# Patient Record
Sex: Male | Born: 2000 | Race: White | Hispanic: No | Marital: Single | Smoking: Never smoker
Health system: Southern US, Community
[De-identification: ages and names within clinical notes are randomized; demographics above are authoritative.]

## PROBLEM LIST (undated history)

## (undated) DIAGNOSIS — T7840XA Allergy, unspecified, initial encounter: Secondary | ICD-10-CM

## (undated) HISTORY — DX: Allergy, unspecified, initial encounter: T78.40XA

## (undated) HISTORY — PX: TONSILLECTOMY: SUR1361

---
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
## 2021-02-27 ENCOUNTER — Ambulatory Visit: Payer: Self-pay | Admitting: Family Medicine

## 2021-05-23 ENCOUNTER — Ambulatory Visit: Payer: Self-pay | Admitting: Family Medicine

## 2021-08-15 ENCOUNTER — Ambulatory Visit: Payer: Self-pay | Admitting: Family Medicine

## 2022-02-19 ENCOUNTER — Ambulatory Visit: Payer: Self-pay | Admitting: Family Medicine

## 2022-06-15 ENCOUNTER — Ambulatory Visit: Payer: Commercial Managed Care - HMO | Admitting: Physician Assistant

## 2022-08-30 ENCOUNTER — Telehealth: Payer: Self-pay | Admitting: Physician Assistant

## 2022-08-30 NOTE — Progress Notes (Signed)
Vivien Rota DeSanto,acting as a scribe for Alfredia Ferguson, PA-C.,have documented all relevant documentation on the behalf of Alfredia Ferguson, PA-C,as directed by  Alfredia Ferguson, PA-C while in the presence of Alfredia Ferguson, PA-C.   New patient visit   Patient: Mario Ruiz   DOB: Aug 19, 2000   21 y.o. Male  MRN: [id number] Visit Date: 08/31/2022  Today's healthcare provider: Alfredia Ferguson, PA-C   Cc. cpe Subjective    Mario Ruiz is a 22 y.o. male who presents today as a new patient to establish care.  HPI  Pt reports a textured rash across his arms and back that has been present for years. Denies itching, pain.  Past Medical History:  Diagnosis Date   Allergy    Past Surgical History:  Procedure Laterality Date   TONSILLECTOMY     age 41-16   No family status information on file.   History reviewed. No pertinent family history. Social History   Socioeconomic History   Marital status: Single    Spouse name: Not on file   Number of children: Not on file   Years of education: Not on file   Highest education level: Not on file  Occupational History   Not on file  Tobacco Use   Smoking status: Never   Smokeless tobacco: Never  Substance and Sexual Activity   Alcohol use: Never   Drug use: Never   Sexual activity: Not on file  Other Topics Concern   Not on file  Social History Narrative   Not on file   Social Determinants of Health   Financial Resource Strain: Not on file  Food Insecurity: Not on file  Transportation Needs: Not on file  Physical Activity: Not on file  Stress: Not on file  Social Connections: Not on file   Outpatient Medications Prior to Visit  Medication Sig   loratadine (CLARITIN) 10 MG tablet Take 10 mg by mouth daily.   No facility-administered medications prior to visit.   No Known Allergies   There is no immunization history on file for this patient.  Health Maintenance  Topic Date Due   COVID-19 Vaccine (1) Never  done   HPV VACCINES (1 - Male 2-dose series) Never done   HIV Screening  Never done   Hepatitis C Screening  Never done   DTaP/Tdap/Td (1 - Tdap) Never done   INFLUENZA VACCINE  11/15/2022    Patient Care Team: Alfredia Ferguson, PA-C as PCP - General (Physician Assistant)  Review of Systems  HENT:  Positive for sneezing.   Respiratory:  Positive for choking.   Genitourinary:  Positive for frequency.  Skin:  Positive for rash.  Allergic/Immunologic: Positive for environmental allergies.    Objective    BP 133/88 (BP Location: Right Arm, Patient Position: Bed low/side rails up)   Pulse (!) 113   Temp 98.3 F (36.8 C) (Oral)   Wt 231 lb (104.8 kg)   SpO2 99%  Vitals:   08/31/22 0925 08/31/22 0933  BP: (!) 157/94 133/88  Pulse: (!) 113   Temp: 98.3 F (36.8 C)   TempSrc: Oral   SpO2: 99%   Weight: 231 lb (104.8 kg)      Physical Exam Constitutional:      General: He is awake.     Appearance: He is well-developed.  HENT:     Head: Normocephalic.     Right Ear: Tympanic membrane, ear canal and external ear normal.     Left Ear: Tympanic membrane,  ear canal and external ear normal.     Nose: Nose normal. No congestion or rhinorrhea.     Mouth/Throat:     Mouth: Mucous membranes are moist.     Pharynx: No oropharyngeal exudate or posterior oropharyngeal erythema.  Eyes:     Pupils: Pupils are equal, round, and reactive to light.  Cardiovascular:     Rate and Rhythm: Normal rate and regular rhythm.     Heart sounds: Normal heart sounds.  Pulmonary:     Effort: Pulmonary effort is normal.     Breath sounds: Normal breath sounds.  Abdominal:     General: There is no distension.     Palpations: Abdomen is soft.     Tenderness: There is no abdominal tenderness. There is no guarding.  Musculoskeletal:     Cervical back: Normal range of motion.     Right lower leg: No edema.     Left lower leg: No edema.  Lymphadenopathy:     Cervical: No cervical adenopathy.   Skin:    General: Skin is warm.     Comments: There is a textured erythematous prickling of the skin across his arms and back  Neurological:     Mental Status: He is alert and oriented to person, place, and time.  Psychiatric:        Attention and Perception: Attention normal.        Mood and Affect: Mood normal.        Speech: Speech normal.        Behavior: Behavior normal. Behavior is cooperative.    Depression Screen    08/31/2022    9:30 AM  PHQ 2/9 Scores  PHQ - 2 Score 0  PHQ- 9 Score 0   No results found for any visits on 08/31/22.  Assessment & Plan      Problem List Items Addressed This Visit       Musculoskeletal and Integument   Keratosis pilaris    Advised combo of exfoliation and hydrating lotion.        Other Visit Diagnoses     Annual physical exam    -  Primary   Relevant Orders   CBC w/Diff/Platelet   Comprehensive Metabolic Panel (CMET)   Lipid Profile        Return in about 1 year (around 08/31/2023) for CPE.     I, Alfredia Ferguson, PA-C have reviewed all documentation for this visit. The documentation on  08/31/22   for the exam, diagnosis, procedures, and orders are all accurate and complete.  Alfredia Ferguson, PA-C [facility] [address] [place], [place], [address] Office: [phone] Fax: [phone]   [facility]

## 2022-08-31 ENCOUNTER — Ambulatory Visit (INDEPENDENT_AMBULATORY_CARE_PROVIDER_SITE_OTHER): Payer: Medicaid Other | Admitting: Physician Assistant

## 2022-08-31 ENCOUNTER — Encounter: Payer: Self-pay | Admitting: Physician Assistant

## 2022-08-31 VITALS — BP 133/88 | HR 113 | Temp 98.3°F | Wt 231.0 lb

## 2022-08-31 DIAGNOSIS — Z0000 Encounter for general adult medical examination without abnormal findings: Secondary | ICD-10-CM | POA: Diagnosis not present

## 2022-08-31 DIAGNOSIS — J302 Other seasonal allergic rhinitis: Secondary | ICD-10-CM | POA: Insufficient documentation

## 2022-08-31 DIAGNOSIS — L858 Other specified epidermal thickening: Secondary | ICD-10-CM | POA: Diagnosis not present

## 2022-08-31 NOTE — Assessment & Plan Note (Signed)
Advised combo of exfoliation and hydrating lotion.

## 2022-09-01 LAB — CBC WITH DIFFERENTIAL/PLATELET
Basophils Absolute: 0 10*3/uL (ref 0.0–0.2)
Basos: 1 %
EOS (ABSOLUTE): 0.3 10*3/uL (ref 0.0–0.4)
Eos: 5 %
Hematocrit: 46.6 % (ref 37.5–51.0)
Hemoglobin: 15.7 g/dL (ref 13.0–17.7)
Immature Grans (Abs): 0 10*3/uL (ref 0.0–0.1)
Immature Granulocytes: 0 %
Lymphocytes Absolute: 1.9 10*3/uL (ref 0.7–3.1)
Lymphs: 37 %
MCH: 30 pg (ref 26.6–33.0)
MCHC: 33.7 g/dL (ref 31.5–35.7)
MCV: 89 fL (ref 79–97)
Monocytes Absolute: 0.4 10*3/uL (ref 0.1–0.9)
Monocytes: 7 %
Neutrophils Absolute: 2.7 10*3/uL (ref 1.4–7.0)
Neutrophils: 50 %
Platelets: 211 10*3/uL (ref 150–450)
RBC: 5.23 x10E6/uL (ref 4.14–5.80)
RDW: 12 % (ref 11.6–15.4)
WBC: 5.3 10*3/uL (ref 3.4–10.8)

## 2022-09-01 LAB — COMPREHENSIVE METABOLIC PANEL
ALT: 54 IU/L — ABNORMAL HIGH (ref 0–44)
AST: 23 IU/L (ref 0–40)
Albumin/Globulin Ratio: 1.7 (ref 1.2–2.2)
Albumin: 4.7 g/dL (ref 4.3–5.2)
Alkaline Phosphatase: 88 IU/L (ref 51–125)
BUN/Creatinine Ratio: 13 (ref 9–20)
BUN: 13 mg/dL (ref 6–20)
Bilirubin Total: 0.4 mg/dL (ref 0.0–1.2)
CO2: 20 mmol/L (ref 20–29)
Calcium: 9.9 mg/dL (ref 8.7–10.2)
Chloride: 106 mmol/L (ref 96–106)
Creatinine, Ser: 1.04 mg/dL (ref 0.76–1.27)
Globulin, Total: 2.7 g/dL (ref 1.5–4.5)
Glucose: 91 mg/dL (ref 70–99)
Potassium: 5.1 mmol/L (ref 3.5–5.2)
Sodium: 142 mmol/L (ref 134–144)
Total Protein: 7.4 g/dL (ref 6.0–8.5)
eGFR: 105 mL/min/{1.73_m2} (ref >59)

## 2022-09-01 LAB — LIPID PANEL
Chol/HDL Ratio: 3.8 ratio (ref 0.0–5.0)
Cholesterol, Total: 166 mg/dL (ref 100–199)
HDL: 44 mg/dL (ref >39)
LDL Chol Calc (NIH): 110 mg/dL — ABNORMAL HIGH (ref 0–99)
Triglycerides: 60 mg/dL (ref 0–149)
VLDL Cholesterol Cal: 12 mg/dL (ref 5–40)

## 2022-09-03 ENCOUNTER — Other Ambulatory Visit: Payer: Self-pay | Admitting: Physician Assistant

## 2022-09-03 DIAGNOSIS — R748 Abnormal levels of other serum enzymes: Secondary | ICD-10-CM

## 2022-11-16 DIAGNOSIS — J069 Acute upper respiratory infection, unspecified: Secondary | ICD-10-CM | POA: Diagnosis not present

## 2022-11-16 DIAGNOSIS — Z20822 Contact with and (suspected) exposure to covid-19: Secondary | ICD-10-CM | POA: Diagnosis not present

## 2022-12-05 ENCOUNTER — Telehealth: Payer: Self-pay

## 2022-12-05 NOTE — Telephone Encounter (Signed)
NA/No VM- if patient calls back ok for PEC to advise that labs have been ordered and no need for appointment. Patient just comes in for labs. Lab opens from 8-11 AM and 1-4 PM

## 2022-12-05 NOTE — Telephone Encounter (Signed)
Copied from CRM [id number]. Topic: General - Other >> Dec 05, 2022  1:39 PM Turkey B wrote: Reason for CRM: pt's mother called in states, pt was supposed to have fu lab work done from what he had done in February. She needs to know when this should be done and scheduled.

## 2022-12-12 NOTE — Telephone Encounter (Signed)
NA. Left detailed message that patient can just come for labs. No appointment is needed.

## 2022-12-14 ENCOUNTER — Other Ambulatory Visit: Payer: Self-pay | Admitting: Family Medicine

## 2022-12-14 DIAGNOSIS — R748 Abnormal levels of other serum enzymes: Secondary | ICD-10-CM

## 2022-12-15 LAB — COMPREHENSIVE METABOLIC PANEL
ALT: 68 IU/L — ABNORMAL HIGH (ref 0–44)
AST: 31 IU/L (ref 0–40)
Albumin: 4.9 g/dL (ref 4.3–5.2)
Alkaline Phosphatase: 91 IU/L (ref 51–125)
BUN/Creatinine Ratio: 12 (ref 9–20)
BUN: 12 mg/dL (ref 6–20)
Bilirubin Total: 0.5 mg/dL (ref 0.0–1.2)
CO2: 19 mmol/L — ABNORMAL LOW (ref 20–29)
Calcium: 10.2 mg/dL (ref 8.7–10.2)
Chloride: 103 mmol/L (ref 96–106)
Creatinine, Ser: 0.99 mg/dL (ref 0.76–1.27)
Globulin, Total: 2.7 g/dL (ref 1.5–4.5)
Glucose: 96 mg/dL (ref 70–99)
Potassium: 5.1 mmol/L (ref 3.5–5.2)
Sodium: 140 mmol/L (ref 134–144)
Total Protein: 7.6 g/dL (ref 6.0–8.5)
eGFR: 112 mL/min/{1.73_m2} (ref >59)

## 2022-12-15 LAB — CBC
Hematocrit: 49.9 % (ref 37.5–51.0)
Hemoglobin: 16.1 g/dL (ref 13.0–17.7)
MCH: 29.8 pg (ref 26.6–33.0)
MCHC: 32.3 g/dL (ref 31.5–35.7)
MCV: 92 fL (ref 79–97)
Platelets: 269 10*3/uL (ref 150–450)
RBC: 5.4 x10E6/uL (ref 4.14–5.80)
RDW: 12.9 % (ref 11.6–15.4)
WBC: 6.8 10*3/uL (ref 3.4–10.8)

## 2022-12-15 LAB — LIPID PANEL
Chol/HDL Ratio: 3.6 ratio (ref 0.0–5.0)
Cholesterol, Total: 155 mg/dL (ref 100–199)
HDL: 43 mg/dL (ref >39)
LDL Chol Calc (NIH): 99 mg/dL (ref 0–99)
Triglycerides: 63 mg/dL (ref 0–149)
VLDL Cholesterol Cal: 13 mg/dL (ref 5–40)

## 2023-01-18 ENCOUNTER — Ambulatory Visit: Payer: Medicaid Other | Admitting: Family Medicine

## 2023-01-24 ENCOUNTER — Encounter: Payer: Self-pay | Admitting: Family Medicine

## 2023-01-25 ENCOUNTER — Ambulatory Visit: Payer: Medicaid Other | Admitting: Family Medicine

## 2023-01-25 ENCOUNTER — Encounter: Payer: Self-pay | Admitting: Family Medicine

## 2023-01-25 VITALS — BP 135/86 | HR 128 | Ht 71.0 in | Wt 233.1 lb

## 2023-01-25 DIAGNOSIS — R768 Other specified abnormal immunological findings in serum: Secondary | ICD-10-CM

## 2023-01-25 DIAGNOSIS — R000 Tachycardia, unspecified: Secondary | ICD-10-CM

## 2023-01-25 DIAGNOSIS — R7401 Elevation of levels of liver transaminase levels: Secondary | ICD-10-CM | POA: Insufficient documentation

## 2023-01-25 DIAGNOSIS — Z23 Encounter for immunization: Secondary | ICD-10-CM

## 2023-01-25 NOTE — Progress Notes (Signed)
Established patient visit   Patient: Mario Ruiz   DOB: July 20, 2000   21 y.o. Male  MRN: [id number] Visit Date: 01/25/2023  Today's healthcare provider: Sherlyn Hay, DO   Chief Complaint  Patient presents with   Medical Management of Chronic Issues    Would like to discuss lab results    Subjective    HPI Fam hx liver prob? no Alcohol use? none  Diet: red meat, potatoes, fruit and vegetables  - eats out once per week  - pork chops, burgers, roast, fries, baked potatoes or other potato dishes, chicken, chicken salad, collard greens.     Medications: Outpatient Medications Prior to Visit  Medication Sig   loratadine (CLARITIN) 10 MG tablet Take 10 mg by mouth daily.   No facility-administered medications prior to visit.    Review of Systems  Respiratory: Negative.  Negative for cough, shortness of breath and wheezing.   Cardiovascular:  Negative for chest pain, palpitations (denied, but was noticeable to him after I pointed it out) and leg swelling.  Gastrointestinal:  Negative for abdominal pain, constipation, diarrhea, nausea and vomiting.  Neurological:  Negative for weakness and headaches.        Objective    BP 135/86 (BP Location: Left Arm, Patient Position: Sitting, Cuff Size: Large)   Pulse (!) 128   Ht 5\' 11"  (1.803 m)   Wt 233 lb 1.6 oz (105.7 kg)   SpO2 99%   BMI 32.51 kg/m     Physical Exam Constitutional:      Appearance: Normal appearance.  HENT:     Head: Normocephalic and atraumatic.  Eyes:     General: No scleral icterus.    Extraocular Movements: Extraocular movements intact.     Conjunctiva/sclera: Conjunctivae normal.  Cardiovascular:     Rate and Rhythm: Regular rhythm. Tachycardia present.     Pulses: Normal pulses.     Heart sounds: Normal heart sounds.  Pulmonary:     Effort: Pulmonary effort is normal. No respiratory distress.     Breath sounds: Normal breath sounds.  Abdominal:     General: Bowel sounds are  normal. There is no distension.     Palpations: Abdomen is soft. There is no mass.     Tenderness: There is no abdominal tenderness. There is no guarding.  Musculoskeletal:     Right lower leg: No edema.     Left lower leg: No edema.  Skin:    General: Skin is warm and dry.  Neurological:     Mental Status: He is alert and oriented to person, place, and time. Mental status is at baseline.  Psychiatric:        Mood and Affect: Mood normal.        Behavior: Behavior normal.      No results found for any visits on 01/25/23.  Assessment & Plan    Elevated transaminase level Assessment & Plan: Discussed options to go ahead and work up further, versus waiting three more months (to allow full six month interval) and rechecking then. Given minor level of increase, will go ahead and defer recheck. Did order labwork for patient to obtain at end of November for recheck.  Orders: -     Hepatic function panel -     Gamma GT  Tachycardia Assessment & Plan: Patient tachycardic on exam, ordered TSH as noted below, particularly as thyroid disorders can contribute to patient's increased ALT. However,since it is unclear whether patient's heart  rate is anxiety-provoked in being in the office vs occurring at baseline, patient will monitor heart rate at home and have level drawn sooner if his tachycardia occurs at home as well. If not, he will have it drawn with the hepatic function test.  Orders: -     TSH Rfx on Abnormal to Free T4  Needs flu shot -     Flu vaccine trivalent PF, 6mos and older(Flulaval,Afluria,Fluarix,Fluzone)    Return in about 3 months (around 04/27/2023).      I discussed the assessment and treatment plan with the patient  The patient was provided an opportunity to ask questions and all were answered. The patient agreed with the plan and demonstrated an understanding of the instructions.   The patient was advised to call back or seek an in-person evaluation if the symptoms  worsen or if the condition fails to improve as anticipated.    Sherlyn Hay, DO  [facility] [phone] (phone) [phone] (fax)  [facility]

## 2023-01-25 NOTE — Assessment & Plan Note (Signed)
Patient tachycardic on exam, ordered TSH as noted below, particularly as thyroid disorders can contribute to patient's increased ALT. However,since it is unclear whether patient's heart rate is anxiety-provoked in being in the office vs occurring at baseline, patient will monitor heart rate at home and have level drawn sooner if his tachycardia occurs at home as well. If not, he will have it drawn with the hepatic function test.

## 2023-01-25 NOTE — Assessment & Plan Note (Signed)
Discussed options to go ahead and work up further, versus waiting three more months (to allow full six month interval) and rechecking then. Given minor level of increase, will go ahead and defer recheck. Did order labwork for patient to obtain at end of November for recheck.

## 2023-03-18 DIAGNOSIS — R7401 Elevation of levels of liver transaminase levels: Secondary | ICD-10-CM | POA: Diagnosis not present

## 2023-03-18 DIAGNOSIS — R000 Tachycardia, unspecified: Secondary | ICD-10-CM | POA: Diagnosis not present

## 2023-03-19 ENCOUNTER — Encounter: Payer: Self-pay | Admitting: Family Medicine

## 2023-03-19 LAB — HEPATIC FUNCTION PANEL
ALT: 72 [IU]/L — ABNORMAL HIGH (ref 0–44)
AST: 31 [IU]/L (ref 0–40)
Albumin: 5 g/dL (ref 4.3–5.2)
Alkaline Phosphatase: 95 [IU]/L (ref 44–121)
Bilirubin Total: 0.4 mg/dL (ref 0.0–1.2)
Bilirubin, Direct: 0.13 mg/dL (ref 0.00–0.40)
Total Protein: 7.8 g/dL (ref 6.0–8.5)

## 2023-03-19 LAB — TSH RFX ON ABNORMAL TO FREE T4: TSH: 1.23 u[IU]/mL (ref 0.450–4.500)

## 2023-03-19 LAB — GAMMA GT: GGT: 39 [IU]/L (ref 0–65)

## 2023-03-22 NOTE — Addendum Note (Signed)
Addended by: Jacquenette Shone on: 03/22/2023 08:01 AM   Modules accepted: Orders

## 2023-03-27 DIAGNOSIS — R7401 Elevation of levels of liver transaminase levels: Secondary | ICD-10-CM | POA: Diagnosis not present

## 2023-03-28 LAB — HEPATITIS C ANTIBODY: Hep C Virus Ab: NONREACTIVE

## 2023-03-28 LAB — HEPATITIS B CORE AB W/REFLEX: Hep B Core Total Ab: NEGATIVE

## 2023-03-28 LAB — HEPATITIS A ANTIBODY, TOTAL: hep A Total Ab: POSITIVE — AB

## 2023-03-28 LAB — HEPATITIS B SURFACE ANTIGEN: Hepatitis B Surface Ag: NEGATIVE

## 2023-03-31 NOTE — Addendum Note (Signed)
Addended by: Jacquenette Shone on: 03/31/2023 07:18 PM   Modules accepted: Orders

## 2023-04-05 DIAGNOSIS — R768 Other specified abnormal immunological findings in serum: Secondary | ICD-10-CM | POA: Diagnosis not present

## 2023-04-06 LAB — HEPATITIS A ANTIBODY, IGM: Hep A IgM: NEGATIVE

## 2023-04-26 ENCOUNTER — Ambulatory Visit (INDEPENDENT_AMBULATORY_CARE_PROVIDER_SITE_OTHER): Payer: Medicaid Other | Admitting: Family Medicine

## 2023-04-26 VITALS — BP 138/85 | HR 128 | Resp 16 | Ht 71.0 in | Wt 237.4 lb

## 2023-04-26 DIAGNOSIS — R7401 Elevation of levels of liver transaminase levels: Secondary | ICD-10-CM | POA: Diagnosis not present

## 2023-04-26 DIAGNOSIS — R1319 Other dysphagia: Secondary | ICD-10-CM

## 2023-04-26 DIAGNOSIS — Z23 Encounter for immunization: Secondary | ICD-10-CM

## 2023-04-26 NOTE — Progress Notes (Signed)
 Established patient visit   Patient: Mario Ruiz   DOB: 2000/04/23   23 y.o. Male  MRN: [id number] Visit Date: 04/26/2023  Today's healthcare provider: LAURAINE LOISE BUOY, DO   Chief Complaint  Patient presents with   Medical Management of Chronic Issues   Subjective    HPI Last Annual exam: 08/31/2022  The patient, with a history of elevated liver enzymes, presents with a peculiar symptom of feeling like something gets stuck in his chest after eating bananas. This sensation, described as a 'rock in the chest,' persists for approximately 45 minutes after consumption and is exclusive to bananas, with no similar experiences reported with other foods or meats. The patient denies any associated burning sensation or other symptoms such as chest or abdominal pain, headaches, vision changes, shortness of breath, dizziness, lightheadedness, fevers, chills, nausea, vomiting, or diarrhea.  The patient also reports a dry back for which he is using Eucerin cream, which seems to be effective. He is currently intermittently taking an allergy pill, Claritin, which is managing his symptoms well. The patient maintains an moderately active lifestyle, assisting his father in the garage and taking walks with his mother. His diet includes hamburgers, chicken, salads, and a variety of fruits.  The patient's blood pressure and pulse rate have been a concern, with regular high readings in the clinic. However, the patient reports lower readings at home, suggesting possible white coat syndrome. The patient denies any alcohol consumption and is not currently sexually active. He has declined HIV screening at this time, citing no risk factors or encounters.     Medications: Outpatient Medications Prior to Visit  Medication Sig   loratadine (CLARITIN) 10 MG tablet Take 10 mg by mouth daily.   No facility-administered medications prior to visit.    Review of Systems  Constitutional:  Negative for chills  and fatigue.  Respiratory: Negative.  Negative for cough, shortness of breath and wheezing.   Cardiovascular:  Negative for chest pain, palpitations and leg swelling.  Gastrointestinal:  Negative for abdominal pain, constipation, diarrhea, nausea and vomiting.  Neurological:  Negative for dizziness, weakness, light-headedness, numbness and headaches.        Objective    BP 138/85 (BP Location: Left Arm, Patient Position: Sitting, Cuff Size: Large)   Pulse (!) 128   Resp 16   Ht 5' 11 (1.803 m)   Wt 237 lb 6.4 oz (107.7 kg)   SpO2 99%   BMI 33.11 kg/m     Physical Exam Vitals and nursing note reviewed.  Constitutional:      General: He is not in acute distress.    Appearance: Normal appearance.  HENT:     Head: Normocephalic and atraumatic.  Eyes:     General: No scleral icterus.    Conjunctiva/sclera: Conjunctivae normal.  Cardiovascular:     Rate and Rhythm: Normal rate.  Pulmonary:     Effort: Pulmonary effort is normal.  Neurological:     Mental Status: He is alert and oriented to person, place, and time. Mental status is at baseline.  Psychiatric:        Mood and Affect: Mood normal.        Behavior: Behavior normal.      No results found for any visits on 04/26/23.  Assessment & Plan    Elevated transaminase level Assessment & Plan: Previous liver enzyme levels were elevated. Plan to recheck liver enzymes in March. If levels remain elevated or increase, an ultrasound will  be considered. Discussed importance of monitoring liver function and potential need for further imaging if abnormalities persist. - Order hepatic function panel in mid-March - Plan for potential ultrasound if liver enzymes remain elevated  Orders: -     Hepatic function panel  Immunization due -     Meningococcal B, OMV -     Tdap vaccine greater than or equal to 7yo IM  Esophageal dysphagia Assessment & Plan: Reports sensation of food getting stuck in chest when eating bananas,  resolving after 45 minutes. No issues with other foods or associated symptoms like burning or pain.  - Monitor symptoms - Consider further evaluation if symptoms persist or worsen   General Health Maintenance Received Tdap and meningococcal vaccines. HPV vaccine is up to date. Declined HIV screening due to no risk factors. Encouraged increased physical activity and weight management to help control blood pressure. Discussed delaying COVID-19 vaccine to next season (September). - Encourage increased physical activity - Discuss weight management strategies - Delay COVID-19 vaccine to next season (September) - Recheck blood pressure at next visit   Return in about 4 months (around 09/02/2023) for CPE, rck swallowing.      I discussed the assessment and treatment plan with the patient  The patient was provided an opportunity to ask questions and all were answered. The patient agreed with the plan and demonstrated an understanding of the instructions.   The patient was advised to call back or seek an in-person evaluation if the symptoms worsen or if the condition fails to improve as anticipated.    LAURAINE LOISE BUOY, DO  [facility] [phone] (phone) [phone] (fax)  [facility]

## 2023-05-04 ENCOUNTER — Encounter: Payer: Self-pay | Admitting: Family Medicine

## 2023-05-04 DIAGNOSIS — R1319 Other dysphagia: Secondary | ICD-10-CM | POA: Insufficient documentation

## 2023-05-04 NOTE — Assessment & Plan Note (Addendum)
Reports sensation of food getting stuck in chest when eating bananas, resolving after 45 minutes. No issues with other foods or associated symptoms like burning or pain.  - Monitor symptoms - Consider further evaluation if symptoms persist or worsen

## 2023-05-04 NOTE — Assessment & Plan Note (Signed)
Previous liver enzyme levels were elevated. Plan to recheck liver enzymes in March. If levels remain elevated or increase, an ultrasound will be considered. Discussed importance of monitoring liver function and potential need for further imaging if abnormalities persist. - Order hepatic function panel in mid-March - Plan for potential ultrasound if liver enzymes remain elevated

## 2023-05-26 DIAGNOSIS — R059 Cough, unspecified: Secondary | ICD-10-CM | POA: Diagnosis not present

## 2023-05-26 DIAGNOSIS — J069 Acute upper respiratory infection, unspecified: Secondary | ICD-10-CM | POA: Diagnosis not present

## 2023-07-09 ENCOUNTER — Telehealth: Payer: Self-pay | Admitting: Family Medicine

## 2023-07-09 DIAGNOSIS — R7401 Elevation of levels of liver transaminase levels: Secondary | ICD-10-CM | POA: Diagnosis not present

## 2023-07-09 NOTE — Telephone Encounter (Signed)
 Pt dropped off form (health examination certificate) to be filled out for his job at [employer].  Call pt when ready for p/u. Placed in Dr. Rexanne Mano box.

## 2023-07-10 LAB — HEPATIC FUNCTION PANEL
ALT: 96 IU/L — ABNORMAL HIGH (ref 0–44)
AST: 35 IU/L (ref 0–40)
Albumin: 4.9 g/dL (ref 4.3–5.2)
Alkaline Phosphatase: 95 IU/L (ref 44–121)
Bilirubin Total: 0.5 mg/dL (ref 0.0–1.2)
Bilirubin, Direct: 0.16 mg/dL (ref 0.00–0.40)
Total Protein: 7.5 g/dL (ref 6.0–8.5)

## 2023-07-11 ENCOUNTER — Encounter: Payer: Self-pay | Admitting: Family Medicine

## 2023-07-12 ENCOUNTER — Encounter: Payer: Self-pay | Admitting: Family Medicine

## 2023-07-12 NOTE — Addendum Note (Signed)
 Addended by: Jacquenette Shone on: 07/12/2023 12:20 PM   Modules accepted: Orders

## 2023-07-15 ENCOUNTER — Encounter: Payer: Self-pay | Admitting: Family Medicine

## 2023-07-16 ENCOUNTER — Other Ambulatory Visit: Payer: Self-pay

## 2023-07-16 DIAGNOSIS — Z111 Encounter for screening for respiratory tuberculosis: Secondary | ICD-10-CM

## 2023-07-19 DIAGNOSIS — R7401 Elevation of levels of liver transaminase levels: Secondary | ICD-10-CM | POA: Diagnosis not present

## 2023-07-20 LAB — HEPATIC FUNCTION PANEL
ALT: 83 IU/L — ABNORMAL HIGH (ref 0–44)
AST: 42 IU/L — ABNORMAL HIGH (ref 0–40)
Albumin: 4.7 g/dL (ref 4.3–5.2)
Alkaline Phosphatase: 85 IU/L (ref 44–121)
Bilirubin Total: 0.4 mg/dL (ref 0.0–1.2)
Bilirubin, Direct: 0.18 mg/dL (ref 0.00–0.40)
Total Protein: 6.9 g/dL (ref 6.0–8.5)

## 2023-07-22 DIAGNOSIS — Z111 Encounter for screening for respiratory tuberculosis: Secondary | ICD-10-CM | POA: Diagnosis not present

## 2023-07-24 NOTE — Telephone Encounter (Signed)
 Patient already had TB done.

## 2023-07-25 LAB — QUANTIFERON-TB GOLD PLUS
QuantiFERON Nil Value: 0.04 [IU]/mL
QuantiFERON TB1 Ag Value: 0.04 [IU]/mL
QuantiFERON TB2 Ag Value: 0.05 [IU]/mL

## 2023-07-29 ENCOUNTER — Encounter: Payer: Self-pay | Admitting: Family Medicine

## 2023-08-09 ENCOUNTER — Ambulatory Visit
Admission: RE | Admit: 2023-08-09 | Discharge: 2023-08-09 | Disposition: A | Discharge status: Home / Self Care | Source: Ambulatory Visit | Attending: Family Medicine | Admitting: Family Medicine

## 2023-08-09 DIAGNOSIS — R7401 Elevation of levels of liver transaminase levels: Secondary | ICD-10-CM | POA: Diagnosis present

## 2023-08-14 ENCOUNTER — Encounter: Payer: Self-pay | Admitting: Family Medicine

## 2023-09-06 ENCOUNTER — Encounter: Payer: Self-pay | Admitting: Family Medicine

## 2023-09-14 ENCOUNTER — Other Ambulatory Visit: Payer: Self-pay

## 2023-09-14 ENCOUNTER — Emergency Department

## 2023-09-14 ENCOUNTER — Emergency Department
Admission: EM | Admit: 2023-09-14 | Discharge: 2023-09-14 | Disposition: A | Discharge status: Home / Self Care | Attending: Emergency Medicine | Admitting: Emergency Medicine

## 2023-09-14 DIAGNOSIS — M79601 Pain in right arm: Secondary | ICD-10-CM | POA: Diagnosis not present

## 2023-09-14 DIAGNOSIS — S50811A Abrasion of right forearm, initial encounter: Secondary | ICD-10-CM | POA: Diagnosis not present

## 2023-09-14 DIAGNOSIS — M25532 Pain in left wrist: Secondary | ICD-10-CM | POA: Diagnosis not present

## 2023-09-14 DIAGNOSIS — S301XXA Contusion of abdominal wall, initial encounter: Secondary | ICD-10-CM | POA: Insufficient documentation

## 2023-09-14 DIAGNOSIS — Y92410 Unspecified street and highway as the place of occurrence of the external cause: Secondary | ICD-10-CM | POA: Diagnosis not present

## 2023-09-14 DIAGNOSIS — S60811A Abrasion of right wrist, initial encounter: Secondary | ICD-10-CM | POA: Insufficient documentation

## 2023-09-14 DIAGNOSIS — R0781 Pleurodynia: Secondary | ICD-10-CM | POA: Diagnosis not present

## 2023-09-14 DIAGNOSIS — S3991XA Unspecified injury of abdomen, initial encounter: Secondary | ICD-10-CM | POA: Diagnosis present

## 2023-09-14 DIAGNOSIS — R0789 Other chest pain: Secondary | ICD-10-CM | POA: Insufficient documentation

## 2023-09-14 LAB — URINALYSIS, ROUTINE W REFLEX MICROSCOPIC
Bilirubin Urine: NEGATIVE
Glucose, UA: NEGATIVE mg/dL
Hgb urine dipstick: NEGATIVE
Ketones, ur: NEGATIVE mg/dL
Leukocytes,Ua: NEGATIVE
Nitrite: NEGATIVE
Protein, ur: NEGATIVE mg/dL
Specific Gravity, Urine: 1.023 (ref 1.005–1.030)
pH: 6 (ref 5.0–8.0)

## 2023-09-14 MED ORDER — NAPROXEN 500 MG PO TABS: 500.0000 mg | ORAL_TABLET | Freq: Two times a day (BID) | ORAL | 0 refills | Status: DC

## 2023-09-14 MED ORDER — METHOCARBAMOL 500 MG PO TABS
500.0000 mg | ORAL_TABLET | Freq: Three times a day (TID) | ORAL | 0 refills | Status: AC | PRN
Start: 2023-09-14 — End: ?

## 2023-09-14 NOTE — Discharge Instructions (Signed)
 Please use ice over the sore areas 20 minutes/h while awake for pain.  Take the muscle relaxer and anti-inflammatory when needed and as directed.  If you take the muscle relaxer, do not drive or do anything that would make you off balance for at least 8 hours after your last dose.  Return to the emergency department if symptoms change or worsen and you are unable to see your primary care provider.
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
## 2023-09-27 ENCOUNTER — Ambulatory Visit (INDEPENDENT_AMBULATORY_CARE_PROVIDER_SITE_OTHER): Admitting: Family Medicine

## 2023-09-27 ENCOUNTER — Encounter: Payer: Self-pay | Admitting: Family Medicine

## 2023-09-27 VITALS — BP 123/76 | HR 82 | Resp 16 | Ht 70.0 in | Wt 221.2 lb

## 2023-09-27 DIAGNOSIS — Z13228 Encounter for screening for other metabolic disorders: Secondary | ICD-10-CM

## 2023-09-27 DIAGNOSIS — Z13220 Encounter for screening for lipoid disorders: Secondary | ICD-10-CM

## 2023-09-27 DIAGNOSIS — R748 Abnormal levels of other serum enzymes: Secondary | ICD-10-CM | POA: Diagnosis not present

## 2023-09-27 DIAGNOSIS — Z130 Encounter for screening for diseases of the blood and blood-forming organs and certain disorders involving the immune mechanism: Secondary | ICD-10-CM

## 2023-09-27 DIAGNOSIS — Z0000 Encounter for general adult medical examination without abnormal findings: Secondary | ICD-10-CM | POA: Diagnosis not present

## 2023-09-27 DIAGNOSIS — Z1329 Encounter for screening for other suspected endocrine disorder: Secondary | ICD-10-CM | POA: Diagnosis not present

## 2023-09-27 NOTE — Progress Notes (Signed)
 Complete physical exam   Patient: Mario Ruiz   DOB: 09-03-2000   22 y.o. Male  MRN: [id number] Visit Date: 09/27/2023  Today's healthcare provider: Carlean Charter, DO   Chief Complaint  Patient presents with   Annual Exam    Exercises daily Sleeping- Good  No concerns   Subjective    Mario Ruiz is a 23 y.o. male who presents today for a complete physical exam.  He reports consuming a general, balanced diet. The patient has a physically strenuous job, but has no regular exercise apart from work.  He generally feels well. He reports sleeping well. He does not have additional problems to discuss today.    HPI HPI     Annual Exam    Additional comments: Exercises daily Sleeping- Good  No concerns      Last edited by Selinda Dales, CMA on 09/27/2023 10:59 AM.      Mario Ruiz is a 23 year old male who presents for an annual physical exam.  He has no current health concerns and feels well overall. 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 He recently started this new job, which has increased his physical activity. He does not regularly use sunscreen despite being outdoors frequently.  He has a history of elevated liver enzymes, which were previously trending down. He is due for a follow-up metabolic panel in August.  He does not regularly visit an eye doctor but has vision insurance through his new job. He visits the dentist regularly.     Past Medical History:  Diagnosis Date   Allergy    Past Surgical History:  Procedure Laterality Date   TONSILLECTOMY     age 58-16   Social History   Socioeconomic History   Marital status: Single    Spouse name: Not on file   Number of children: Not on file   Years of education: Not on file   Highest education level: Not on file  Occupational History   Not on file  Tobacco Use    Smoking status: Never   Smokeless tobacco: Never  Substance and Sexual Activity   Alcohol use: Never   Drug use: Never   Sexual activity: Not on file  Other Topics Concern   Not on file  Social History Narrative   Not on file   Social Drivers of Health   Financial Resource Strain: Low Risk  (09/27/2023)   Overall Financial Resource Strain (CARDIA)    Difficulty of Paying Living Expenses: Not hard at all  Food Insecurity: No Food Insecurity (09/27/2023)   Hunger Vital Sign    Worried About Running Out of Food in the Last Year: Never true    Ran Out of Food in the Last Year: Never true  Transportation Needs: No Transportation Needs (09/27/2023)   PRAPARE - Administrator, Civil Service (Medical): No    Lack of Transportation (Non-Medical): No  Physical Activity: Sufficiently Active (09/27/2023)   Exercise Vital Sign    Days of Exercise per Week: 6 days    Minutes of Exercise per Session: 150+ min  Stress: No Stress Concern Present (09/27/2023)   [employer] of Occupational Health - Occupational Stress Questionnaire    Feeling of Stress: Not at all  Social Connections: Not on file  Intimate Partner Violence: Not At Risk (09/27/2023)  Humiliation, Afraid, Rape, and Kick questionnaire    Fear of Current or Ex-Partner: No    Emotionally Abused: No    Physically Abused: No    Sexually Abused: No   No family status information on file.   History reviewed. No pertinent family history. No Known Allergies  Patient Care Team: Trace Wirick N, DO as PCP - General (Family Medicine)   Medications: Outpatient Medications Prior to Visit  Medication Sig   loratadine (CLARITIN) 10 MG tablet Take 10 mg by mouth daily.   [DISCONTINUED] methocarbamol  (ROBAXIN ) 500 MG tablet Take 1 tablet (500 mg total) by mouth every 8 (eight) hours as needed.   [DISCONTINUED] naproxen  (NAPROSYN ) 500 MG tablet Take 1 tablet (500 mg total) by mouth 2 (two) times daily with a meal.   No  facility-administered medications prior to visit.    Review of Systems  Constitutional:  Negative for appetite change, chills, fatigue and fever.  HENT:  Negative for congestion, ear pain, hearing loss, nosebleeds and trouble swallowing.   Eyes:  Negative for pain and visual disturbance.  Respiratory:  Negative for cough, chest tightness and shortness of breath.   Cardiovascular:  Negative for chest pain, palpitations and leg swelling.  Gastrointestinal:  Negative for abdominal pain, blood in stool, constipation, diarrhea, nausea and vomiting.  Endocrine: Negative for cold intolerance, heat intolerance, polydipsia, polyphagia and polyuria.  Genitourinary:  Negative for dysuria and flank pain.  Musculoskeletal:  Negative for arthralgias, back pain, joint swelling, myalgias and neck stiffness.  Skin:  Negative for color change, rash and wound.  Neurological:  Negative for dizziness, tremors, seizures, speech difficulty, weakness, light-headedness and headaches.  Psychiatric/Behavioral:  Negative for behavioral problems, confusion, decreased concentration, dysphoric mood and sleep disturbance. The patient is not nervous/anxious.   All other systems reviewed and are negative.           Objective    BP 123/76 (BP Location: Left Arm, Patient Position: Sitting, Cuff Size: Normal)   Pulse 82   Resp 16   Ht 5' 10 (1.778 m)   Wt 221 lb 3.2 oz (100.3 kg)   SpO2 100%   BMI 31.74 kg/m    Physical Exam Vitals and nursing note reviewed.  Constitutional:      General: He is awake.     Appearance: Normal appearance.  HENT:     Head: Normocephalic and atraumatic.     Right Ear: Tympanic membrane, ear canal and external ear normal.     Left Ear: Tympanic membrane, ear canal and external ear normal.     Nose: Nose normal.     Mouth/Throat:     Mouth: Mucous membranes are moist.     Pharynx: Oropharynx is clear. No oropharyngeal exudate or posterior oropharyngeal erythema.   Eyes:      General: No scleral icterus.    Extraocular Movements: Extraocular movements intact.     Conjunctiva/sclera: Conjunctivae normal.     Pupils: Pupils are equal, round, and reactive to light.   Neck:     Thyroid : No thyromegaly or thyroid  tenderness.   Cardiovascular:     Rate and Rhythm: Normal rate and regular rhythm.     Pulses: Normal pulses.     Heart sounds: Normal heart sounds.  Pulmonary:     Effort: Pulmonary effort is normal. No tachypnea, bradypnea or respiratory distress.     Breath sounds: Normal breath sounds. No stridor. No wheezing, rhonchi or rales.  Abdominal:     General: Bowel sounds are  normal. There is no distension.     Palpations: Abdomen is soft. There is no mass.     Tenderness: There is no abdominal tenderness. There is no guarding.     Hernia: No hernia is present.   Musculoskeletal:     Cervical back: Normal range of motion and neck supple.     Right lower leg: No edema.     Left lower leg: No edema.  Lymphadenopathy:     Cervical: No cervical adenopathy.   Skin:    General: Skin is warm and dry.   Neurological:     Mental Status: He is alert and oriented to person, place, and time. Mental status is at baseline.   Psychiatric:        Mood and Affect: Mood normal.        Behavior: Behavior normal.      Last depression screening scores    09/27/2023   11:03 AM 04/26/2023    9:02 AM 08/31/2022    9:30 AM  PHQ 2/9 Scores  PHQ - 2 Score 0 0 0  PHQ- 9 Score 0  0   Last fall risk screening    09/27/2023   11:03 AM  Fall Risk   Falls in the past year? 0  Number falls in past yr: 0  Injury with Fall? 0  Risk for fall due to : No Fall Risks   Last Audit-C alcohol use screening    09/27/2023   11:01 AM  Alcohol Use Disorder Test (AUDIT)  1. How often do you have a drink containing alcohol? 0  2. How many drinks containing alcohol do you have on a typical day when you are drinking? 0  3. How often do you have six or more drinks on one  occasion? 0  AUDIT-C Score 0   A score of 3 or more in women, and 4 or more in men indicates increased risk for alcohol abuse, EXCEPT if all of the points are from question 1   No results found for any visits on 09/27/23.  Assessment & Plan    Routine Health Maintenance and Physical Exam  Exercise Activities and Dietary recommendations  Goals   None     Immunization History  Administered Date(s) Administered   Dtap, Unspecified 05/05/2002, 07/03/2002, 09/04/2002, 07/06/2004, 01/07/2007   HIB, Unspecified 05/05/2002, 07/03/2002, 09/04/2002, 06/09/2003   HPV Quadrivalent 11/09/2013, 01/11/2014, 11/11/2014   Hep A, Unspecified 01/07/2007, 07/08/2007   Hep B, Unspecified 03/01/2002, 05/05/2002, 09/04/2002   IPV 05/05/2002, 07/03/2002, 09/04/2002, 01/07/2007   Influenza, Seasonal, Injecte, Preservative Fre 01/25/2023   Influenza,inj,Quad PF,6+ Mos 05/07/2017   MMR 07/07/2003, 01/07/2007   Meningococcal B, OMV 11/05/2018, 04/26/2023   Meningococcal Conjugate 11/05/2018   Meningococcal Mcv4,unspecified 11/09/2013   Pneumococcal Conjugate PCV 7 05/05/2002, 07/03/2002, 01/07/2003, 06/09/2003   Td (Adult),unspecified 08/28/2012   Tdap 08/28/2012, 04/26/2023   Varicella 07/07/2003, 01/07/2007    Health Maintenance  Topic Date Due   COVID-19 Vaccine (1 - 2024-25 season) 12/16/2023 (Originally 12/16/2022)   HIV Screening  04/25/2024 (Originally 03/16/2017)   INFLUENZA VACCINE  11/15/2023   DTaP/Tdap/Td (9 - Td or Tdap) 04/25/2033   HPV VACCINES  Completed   Hepatitis C Screening  Completed   Meningococcal B Vaccine  Completed    Discussed health benefits of physical activity, and encouraged him to engage in regular exercise appropriate for his age and condition.   Annual physical exam -     Comprehensive metabolic panel with GFR  Screening for endocrine,  metabolic and immunity disorder -     Comprehensive metabolic panel with GFR  Elevated liver enzymes -     Comprehensive  metabolic panel with GFR  Screening for lipid disorders -     Lipid panel   Annual physical exam 23 year old male, physically active, balanced diet, no current health concerns. Physical exam overall unremarkable except as noted above. Routine lab work ordered as noted. Lacks regular sunscreen use and eye exams. Advised on STI prevention. - Order cholesterol and metabolic panels in September, ensure fasting. - Encourage regular sunscreen use during prolonged sun exposure. - Continue healthy diet and physical activity - Advise monitoring skin for new or changing lesions. - Recommend regular eye examinations, utilize vision insurance. - Advise condom use if sexually active. - Plan follow-up in one year unless new concerns arise.    Return in about 1 year (around 09/26/2024) for CPE.     I discussed the assessment and treatment plan with the patient  The patient was provided an opportunity to ask questions and all were answered. The patient agreed with the plan and demonstrated an understanding of the instructions.   The patient was advised to call back or seek an in-person evaluation if the symptoms worsen or if the condition fails to improve as anticipated.    Carlean Charter, DO  [facility] [phone] (phone) [phone] (fax)  [facility]

## 2023-09-27 NOTE — Patient Instructions (Signed)
 Schedule an eye exam annually.

## 2023-11-30 ENCOUNTER — Encounter: Payer: Self-pay | Admitting: Family Medicine

## 2023-11-30 LAB — LIPID PANEL
Chol/HDL Ratio: 3.7 ratio (ref 0.0–5.0)
Cholesterol, Total: 148 mg/dL (ref 100–199)
HDL: 40 mg/dL (ref >39)
LDL Chol Calc (NIH): 94 mg/dL (ref 0–99)
Triglycerides: 73 mg/dL (ref 0–149)
VLDL Cholesterol Cal: 14 mg/dL (ref 5–40)

## 2023-11-30 LAB — COMPREHENSIVE METABOLIC PANEL WITH GFR
ALT: 30 [IU]/L (ref 0–44)
AST: 19 [IU]/L (ref 0–40)
Albumin: 4.5 g/dL (ref 4.3–5.2)
Alkaline Phosphatase: 101 [IU]/L (ref 44–121)
BUN/Creatinine Ratio: 10 (ref 9–20)
BUN: 12 mg/dL (ref 6–20)
Bilirubin Total: 0.4 mg/dL (ref 0.0–1.2)
CO2: 19 mmol/L — ABNORMAL LOW (ref 20–29)
Calcium: 9.8 mg/dL (ref 8.7–10.2)
Chloride: 111 mmol/L — ABNORMAL HIGH (ref 96–106)
Creatinine, Ser: 1.15 mg/dL (ref 0.76–1.27)
Globulin, Total: 2.4 g/dL (ref 1.5–4.5)
Glucose: 105 mg/dL — ABNORMAL HIGH (ref 70–99)
Potassium: 4.8 mmol/L (ref 3.5–5.2)
Sodium: 148 mmol/L — ABNORMAL HIGH (ref 134–144)
Total Protein: 6.9 g/dL (ref 6.0–8.5)
eGFR: 93 mL/min/{1.73_m2}

## 2023-12-06 ENCOUNTER — Ambulatory Visit: Payer: Self-pay | Admitting: Family Medicine

## 2024-10-02 ENCOUNTER — Encounter: Admitting: Family Medicine
